# Patient Record
Sex: Female | Born: 1940 | Race: White | Hispanic: No | Marital: Married | Smoking: Never smoker
Health system: Southern US, Community
[De-identification: ages and names within clinical notes are randomized; demographics above are authoritative.]

## PROBLEM LIST (undated history)

## (undated) HISTORY — PX: ORTHOPEDIC SURGERY: SHX850

## (undated) HISTORY — PX: TONSILLECTOMY: SUR1361

---

## 1999-07-14 ENCOUNTER — Ambulatory Visit (HOSPITAL_COMMUNITY): Admission: RE | Admit: 1999-07-14 | Discharge: 1999-07-14 | Payer: Self-pay | Admitting: Gastroenterology

## 1999-09-26 ENCOUNTER — Other Ambulatory Visit: Admission: RE | Admit: 1999-09-26 | Discharge: 1999-09-26 | Payer: Self-pay | Admitting: Gynecology

## 2016-07-23 ENCOUNTER — Encounter (HOSPITAL_COMMUNITY): Payer: Self-pay | Admitting: Emergency Medicine

## 2016-07-23 DIAGNOSIS — X58XXXA Exposure to other specified factors, initial encounter: Secondary | ICD-10-CM | POA: Diagnosis not present

## 2016-07-23 DIAGNOSIS — Y999 Unspecified external cause status: Secondary | ICD-10-CM | POA: Insufficient documentation

## 2016-07-23 DIAGNOSIS — Y929 Unspecified place or not applicable: Secondary | ICD-10-CM | POA: Diagnosis not present

## 2016-07-23 DIAGNOSIS — Y9389 Activity, other specified: Secondary | ICD-10-CM | POA: Diagnosis not present

## 2016-07-23 DIAGNOSIS — S60221A Contusion of right hand, initial encounter: Secondary | ICD-10-CM | POA: Insufficient documentation

## 2016-07-23 DIAGNOSIS — S6991XA Unspecified injury of right wrist, hand and finger(s), initial encounter: Secondary | ICD-10-CM | POA: Diagnosis present

## 2016-07-23 NOTE — ED Triage Notes (Signed)
Pt c/o 4/10 right hand pain, pt states she was taking her pants out and got hit with the bell on her hand the hands look swollen on arrival good pulses.

## 2016-07-24 ENCOUNTER — Emergency Department (HOSPITAL_COMMUNITY)
Admission: EM | Admit: 2016-07-24 | Discharge: 2016-07-24 | Disposition: A | Discharge status: Home / Self Care | Payer: Medicare Other | Attending: Emergency Medicine | Admitting: Emergency Medicine

## 2016-07-24 ENCOUNTER — Emergency Department (HOSPITAL_COMMUNITY): Payer: Medicare Other

## 2016-07-24 DIAGNOSIS — S60221A Contusion of right hand, initial encounter: Secondary | ICD-10-CM

## 2016-07-24 NOTE — ED Provider Notes (Signed)
[facility]-EMERGENCY DEPT Provider Note   CSN: [id number] Arrival date & time: 07/23/16  2339   By signing my name below, I, Sylvia Torres, attest that this documentation has been prepared under the direction and in the presence of Sylvia Torres, Ankit, MD. Electronically signed, Sylvia Torres, ED Scribe. 07/24/16. 3:09 AM.   History   Chief Complaint Chief Complaint  Patient presents with  . Hand Pain   HPI  Sylvia Torres is a 76 y.o. female presents to the emergency department with chief complaint of right hand swelling which started this evening after she struck the hand with her belt buckle.  She states that there was only minimal swelling immediately following the injury, but then when she was flossing her teeth, she bent her hand, and it immediately swelled up. She complains only mild pain. She has not taken anything for symptoms. She states she was bitten by a cat  ~7 weeks ago and took a 2 week course of abx for the issue.   History reviewed. No pertinent past medical history.  There are no active problems to display for this patient.   Past Surgical History:  Procedure Laterality Date  . ORTHOPEDIC SURGERY    . TONSILLECTOMY      OB History    No data available       Home Medications    Prior to Admission medications   Not on File    Family History History reviewed. No pertinent family history.  Social History Social History  Substance Use Topics  . Smoking status: Never Smoker  . Smokeless tobacco: Never Used  . Alcohol use No     Allergies   Patient has no known allergies.   Review of Systems Review of Systems  All other systems reviewed and are negative.    Physical Exam Updated Vital Signs BP (!) 213/89 (BP Location: Right Arm)   Pulse 74   Temp 98.4 F (36.9 C) (Oral)   Resp 19   Ht 4\' 10"  (1.473 m)   Wt 130 lb (59 kg)   SpO2 99%   BMI 27.17 kg/m   Physical Exam Nursing note and vitals reviewed.  Constitutional: Pt appears  well-developed and well-nourished. No distress.  HENT:  Head: Normocephalic and atraumatic.  Eyes: Conjunctivae are normal.  Neck: Normal range of motion.  Cardiovascular: Normal rate, regular rhythm. Intact distal pulses.   Capillary refill < 3 sec.  Pulmonary/Chest: Effort normal and breath sounds normal.  Musculoskeletal:  Right hand with swelling posteriorly, range of motion and strength limited, no bony abnormality or deformity Extensor strength is 5/5 throughout, decreased flexion secondary to swelling     Neurological: Pt  is alert. Coordination normal.  Sensation: 5/5  Skin: Skin is warm and dry. Pt is not diaphoretic.  No evidence of open wound or skin tenting Psychiatric: Pt has a normal mood and affect.     ED Treatments / Results  DIAGNOSTIC STUDIES:   COORDINATION OF CARE:    Labs (all labs ordered are listed, but only abnormal results are displayed) Labs Reviewed - No data to display  EKG  EKG Interpretation None       Radiology Dg Hand Complete Right  Result Date: 07/24/2016 CLINICAL DATA:  Injury with pain EXAM: RIGHT HAND - COMPLETE 3+ VIEW COMPARISON:  None. FINDINGS: No fracture or malalignment is seen. Large dorsal soft tissue swelling. Degenerative changes at the DIP joints. IMPRESSION: 1. No acute osseous abnormality 2. Large amount of dorsal soft tissue swelling  Electronically Signed   By: Sylvia Torres M.D.   On: 07/24/2016 00:37    Procedures Procedures (including critical care time)  Medications Ordered in ED Medications - No data to display   Initial Impression / Assessment and Plan / ED Course  I have reviewed the triage vital signs and the nursing notes.  Pertinent labs & imaging results that were available during my care of the patient were reviewed by me and considered in my medical decision making (see chart for details).      Patient X-Ray negative for obvious fracture or dislocation.  Pt advised to follow up with PCP.  Patient given Ace wrap for compression while in ED, conservative therapy recommended and discussed. Patient will be discharged home & is agreeable with above plan. Returns precautions discussed. Pt appears safe for discharge. Patient seen by and discussed with Dr. Rhunette Croft, who agrees no evidence for compartment syndrome. Discharged home with PCP follow-up.    Final Clinical Impressions(s) / ED Diagnoses   Final diagnoses:  Contusion of right hand, initial encounter    New Prescriptions New Prescriptions   No medications on file     Sylvia Torres, Sylvia Torres 07/24/16 [id number]    Sylvia Kaplan, MD 07/25/16 0030

## 2016-07-24 NOTE — ED Notes (Signed)
Pt verbalized understanding discharge instructions and denies any further needs or questions at this time. VS stable, ambulatory and steady gait.   

## 2018-04-29 IMAGING — DX DG HAND COMPLETE 3+V*R*
3 series · 3 of 3 positions shown · non-contrast
Comparison: None.

CLINICAL DATA: Injury with pain

EXAM:
RIGHT HAND - COMPLETE 3+ VIEW

[hand pa]
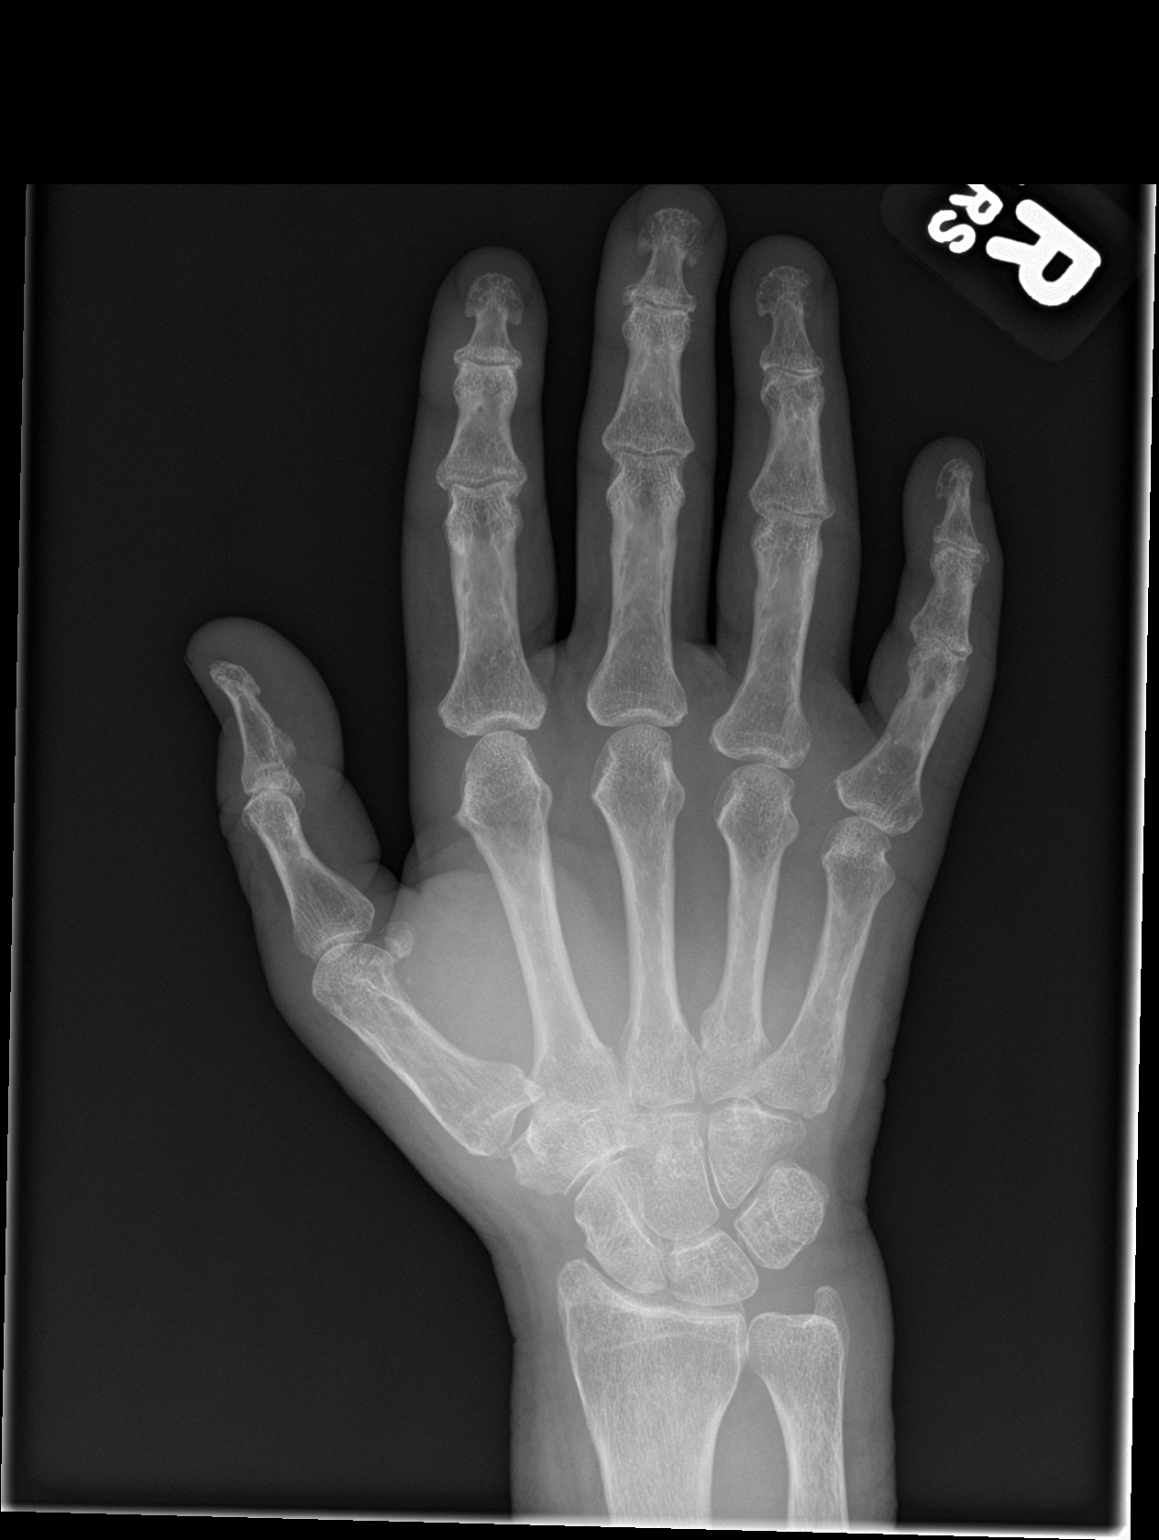

[hand obl]
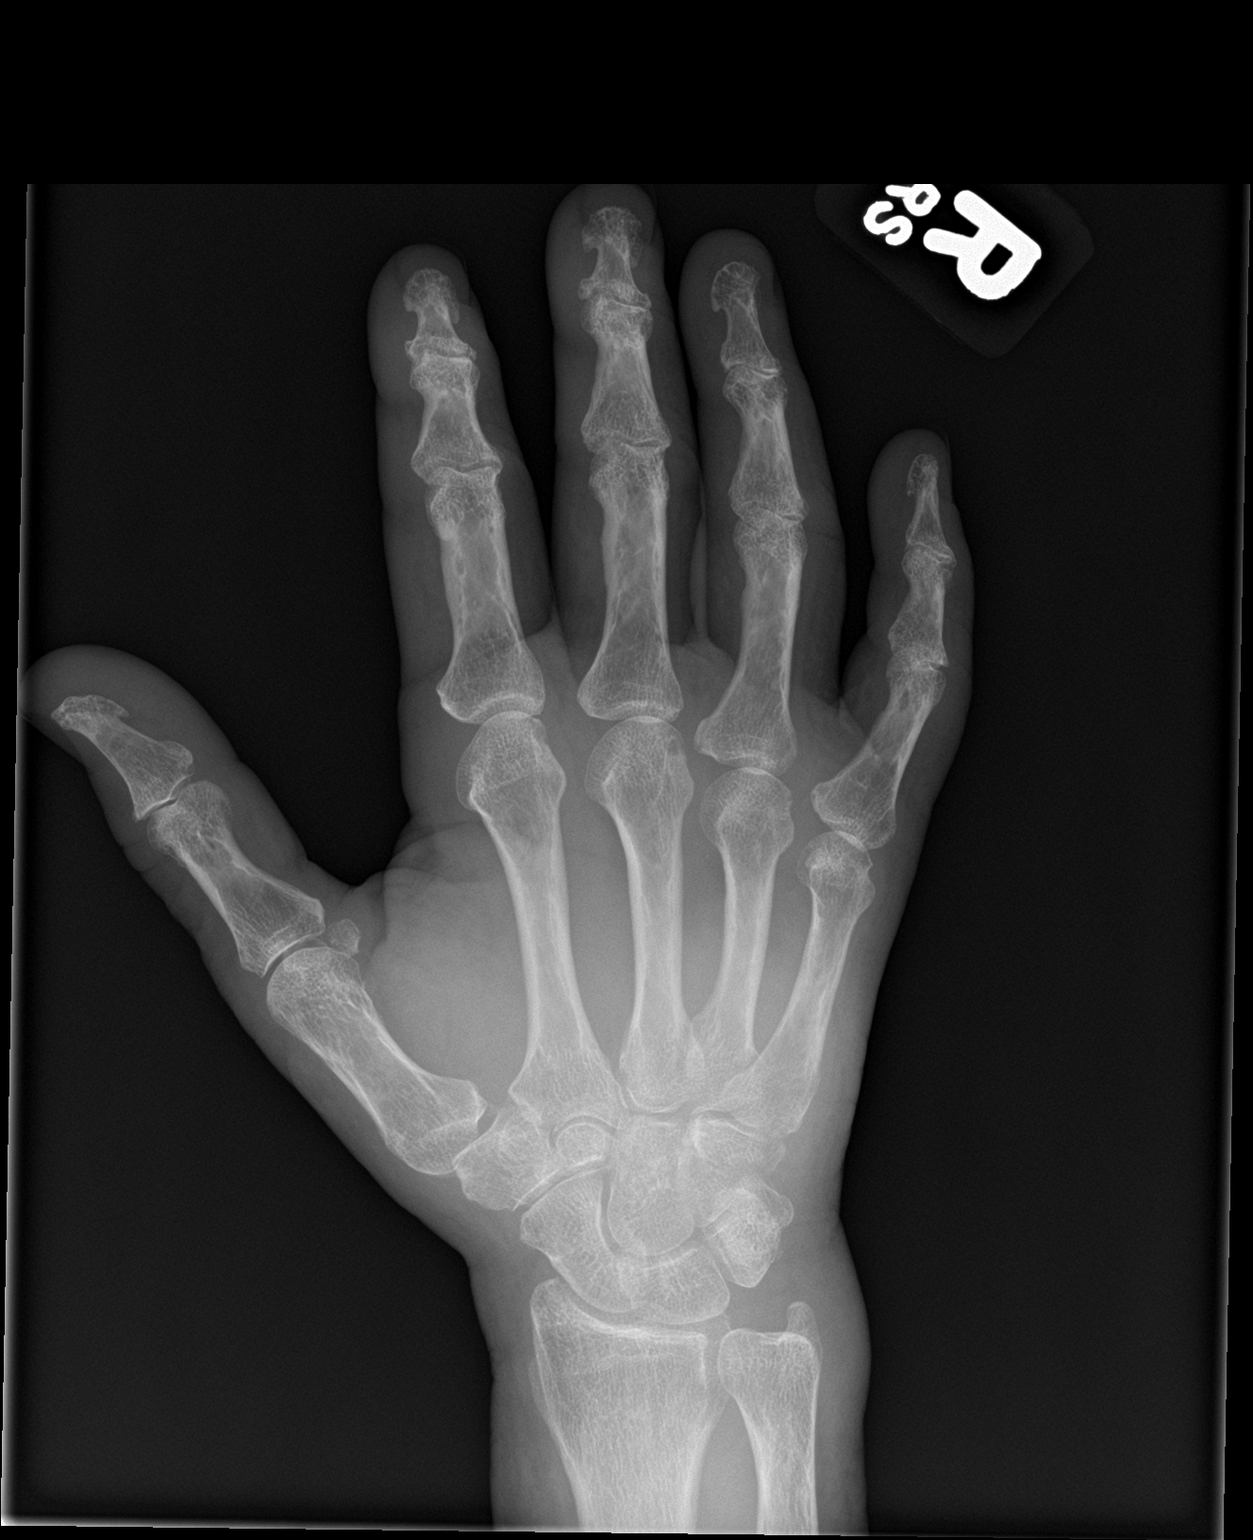

[hand lat]
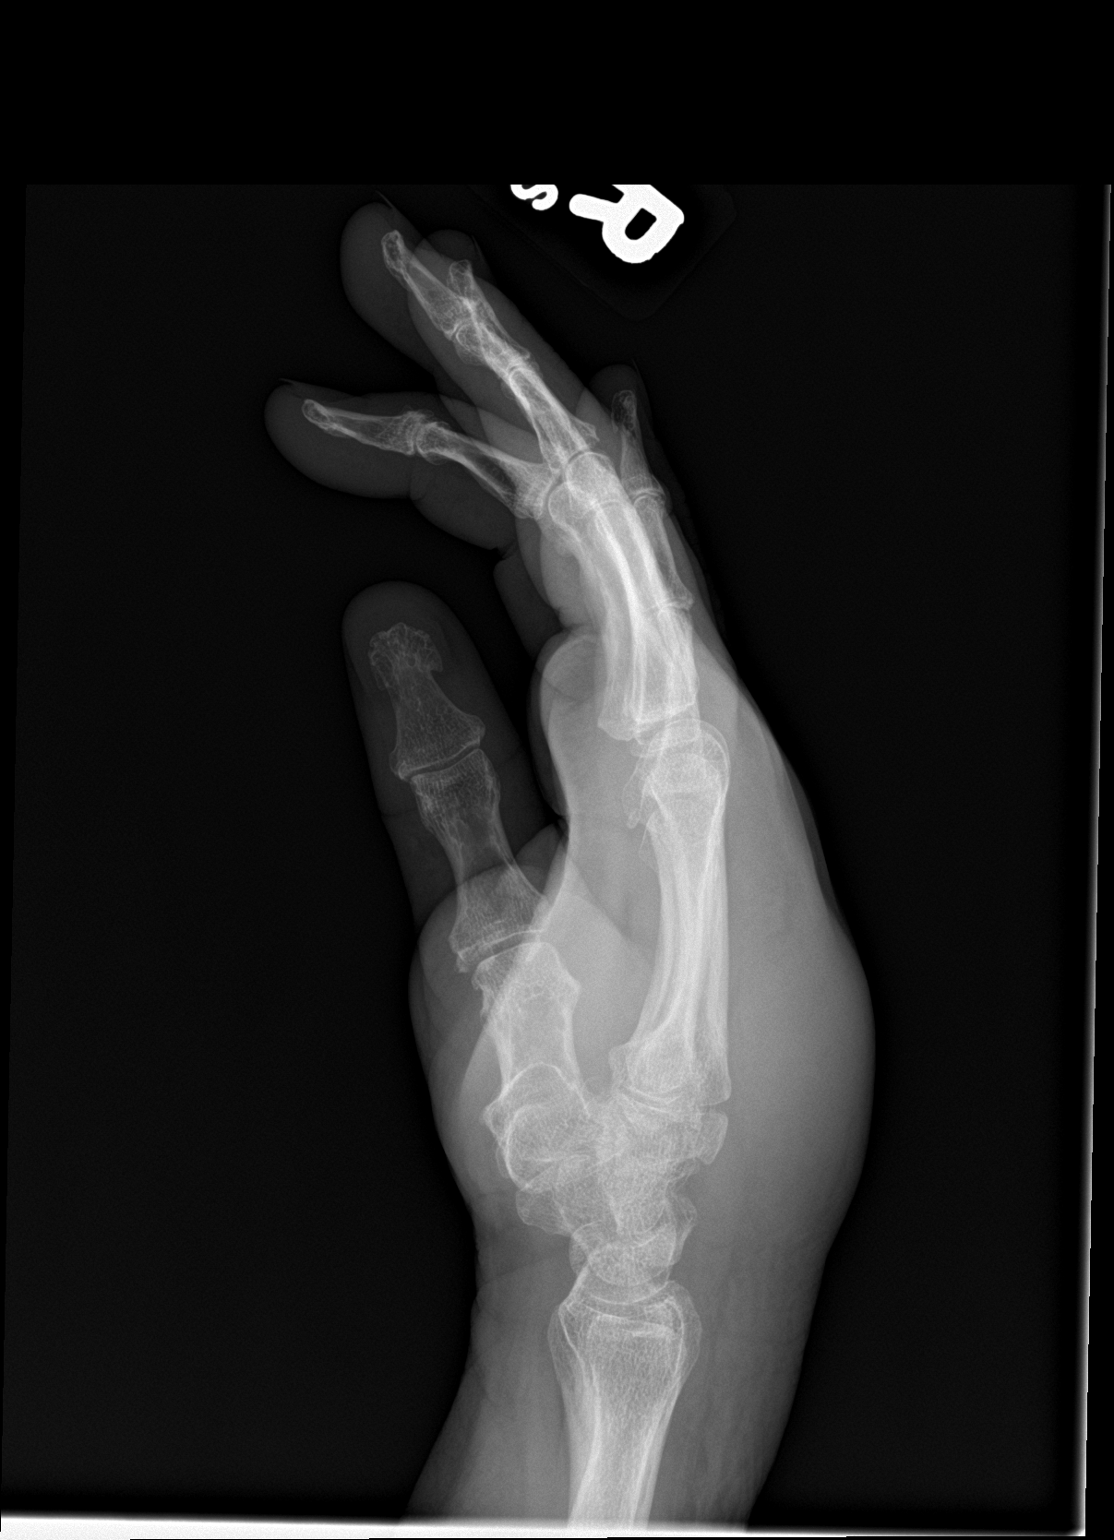

[3 of 3 positions shown; findings below may reference images not displayed]

FINDINGS: No fracture or malalignment is seen. Large dorsal soft tissue
swelling. Degenerative changes at the DIP joints.
IMPRESSION: 1. No acute osseous abnormality
2. Large amount of dorsal soft tissue swelling

## 2023-06-27 ENCOUNTER — Other Ambulatory Visit: Payer: Self-pay | Admitting: Ophthalmology

## 2023-06-27 DIAGNOSIS — H05241 Constant exophthalmos, right eye: Secondary | ICD-10-CM

## 2023-07-11 ENCOUNTER — Ambulatory Visit
Admission: RE | Admit: 2023-07-11 | Discharge: 2023-07-11 | Disposition: A | Discharge status: Home / Self Care | Source: Ambulatory Visit | Attending: Ophthalmology | Admitting: Ophthalmology

## 2023-07-11 DIAGNOSIS — H05241 Constant exophthalmos, right eye: Secondary | ICD-10-CM

## 2023-08-21 ENCOUNTER — Other Ambulatory Visit: Payer: Self-pay | Admitting: Ophthalmology

## 2023-08-21 DIAGNOSIS — H05241 Constant exophthalmos, right eye: Secondary | ICD-10-CM

## 2023-08-27 ENCOUNTER — Encounter: Payer: Self-pay | Admitting: Ophthalmology

## 2023-08-30 ENCOUNTER — Other Ambulatory Visit
